# Patient Record
Sex: Male | Born: 1965 | Race: Asian | Hispanic: No | Marital: Married
Health system: Southern US, Community
[De-identification: ages and names within clinical notes are randomized; demographics above are authoritative.]

## PROBLEM LIST (undated history)

## (undated) DIAGNOSIS — I10 Essential (primary) hypertension: Secondary | ICD-10-CM

## (undated) DIAGNOSIS — N189 Chronic kidney disease, unspecified: Secondary | ICD-10-CM

## (undated) DIAGNOSIS — E785 Hyperlipidemia, unspecified: Secondary | ICD-10-CM

---

## 2020-02-24 ENCOUNTER — Emergency Department
Admission: EM | Admit: 2020-02-24 | Discharge: 2020-02-24 | Disposition: A | Discharge status: Other Acute Inpatient Hospital | Payer: BC Managed Care – PPO | Attending: Emergency Medicine | Admitting: Emergency Medicine

## 2020-02-24 ENCOUNTER — Emergency Department: Payer: BC Managed Care – PPO

## 2020-02-24 ENCOUNTER — Encounter: Payer: Self-pay | Admitting: Emergency Medicine

## 2020-02-24 DIAGNOSIS — I129 Hypertensive chronic kidney disease with stage 1 through stage 4 chronic kidney disease, or unspecified chronic kidney disease: Secondary | ICD-10-CM | POA: Insufficient documentation

## 2020-02-24 DIAGNOSIS — N189 Chronic kidney disease, unspecified: Secondary | ICD-10-CM | POA: Insufficient documentation

## 2020-02-24 DIAGNOSIS — R519 Headache, unspecified: Secondary | ICD-10-CM | POA: Diagnosis present

## 2020-02-24 DIAGNOSIS — R4182 Altered mental status, unspecified: Secondary | ICD-10-CM

## 2020-02-24 DIAGNOSIS — Z20822 Contact with and (suspected) exposure to covid-19: Secondary | ICD-10-CM | POA: Insufficient documentation

## 2020-02-24 DIAGNOSIS — R569 Unspecified convulsions: Secondary | ICD-10-CM | POA: Diagnosis not present

## 2020-02-24 DIAGNOSIS — I609 Nontraumatic subarachnoid hemorrhage, unspecified: Secondary | ICD-10-CM | POA: Insufficient documentation

## 2020-02-24 HISTORY — DX: Chronic kidney disease, unspecified: N18.9

## 2020-02-24 HISTORY — DX: Hyperlipidemia, unspecified: E78.5

## 2020-02-24 HISTORY — DX: Essential (primary) hypertension: I10

## 2020-02-24 LAB — URINE DRUG SCREEN, QUALITATIVE (ARMC ONLY)
Amphetamines, Ur Screen: NOT DETECTED
Barbiturates, Ur Screen: NOT DETECTED
Benzodiazepine, Ur Scrn: NOT DETECTED
Cannabinoid 50 Ng, Ur ~~LOC~~: NOT DETECTED
Cocaine Metabolite,Ur ~~LOC~~: NOT DETECTED
MDMA (Ecstasy)Ur Screen: NOT DETECTED
Methadone Scn, Ur: NOT DETECTED
Opiate, Ur Screen: NOT DETECTED
Phencyclidine (PCP) Ur S: NOT DETECTED
Tricyclic, Ur Screen: NOT DETECTED

## 2020-02-24 LAB — CBC WITH DIFFERENTIAL/PLATELET
Abs Immature Granulocytes: 0.08 10*3/uL — ABNORMAL HIGH (ref 0.00–0.07)
Basophils Absolute: 0.1 10*3/uL (ref 0.0–0.1)
Basophils Relative: 0 %
Eosinophils Absolute: 0.1 10*3/uL (ref 0.0–0.5)
Eosinophils Relative: 1 %
HCT: 42.5 % (ref 39.0–52.0)
Hemoglobin: 13.9 g/dL (ref 13.0–17.0)
Immature Granulocytes: 1 %
Lymphocytes Relative: 18 %
Lymphs Abs: 3.2 10*3/uL (ref 0.7–4.0)
MCH: 28.1 pg (ref 26.0–34.0)
MCHC: 32.7 g/dL (ref 30.0–36.0)
MCV: 85.9 fL (ref 80.0–100.0)
Monocytes Absolute: 0.6 10*3/uL (ref 0.1–1.0)
Monocytes Relative: 4 %
Neutro Abs: 13.3 10*3/uL — ABNORMAL HIGH (ref 1.7–7.7)
Neutrophils Relative %: 76 %
Platelets: 222 10*3/uL (ref 150–400)
RBC: 4.95 MIL/uL (ref 4.22–5.81)
RDW: 12.9 % (ref 11.5–15.5)
WBC: 17.4 10*3/uL — ABNORMAL HIGH (ref 4.0–10.5)
nRBC: 0 % (ref 0.0–0.2)

## 2020-02-24 LAB — COMPREHENSIVE METABOLIC PANEL
ALT: 11 U/L (ref 0–44)
AST: 20 U/L (ref 15–41)
Albumin: 3.6 g/dL (ref 3.5–5.0)
Alkaline Phosphatase: 80 U/L (ref 38–126)
Anion gap: 14 (ref 5–15)
BUN: 32 mg/dL — ABNORMAL HIGH (ref 6–20)
CO2: 19 mmol/L — ABNORMAL LOW (ref 22–32)
Calcium: 8.1 mg/dL — ABNORMAL LOW (ref 8.9–10.3)
Chloride: 107 mmol/L (ref 98–111)
Creatinine, Ser: 3.65 mg/dL — ABNORMAL HIGH (ref 0.61–1.24)
GFR, Estimated: 19 mL/min — ABNORMAL LOW (ref >60)
Glucose, Bld: 222 mg/dL — ABNORMAL HIGH (ref 70–99)
Potassium: 3.4 mmol/L — ABNORMAL LOW (ref 3.5–5.1)
Sodium: 140 mmol/L (ref 135–145)
Total Bilirubin: 0.5 mg/dL (ref 0.3–1.2)
Total Protein: 7.3 g/dL (ref 6.5–8.1)

## 2020-02-24 LAB — URINALYSIS, COMPLETE (UACMP) WITH MICROSCOPIC
Bilirubin Urine: NEGATIVE
Glucose, UA: 150 mg/dL — AB
Ketones, ur: NEGATIVE mg/dL
Leukocytes,Ua: NEGATIVE
Nitrite: NEGATIVE
Protein, ur: 100 mg/dL — AB
Specific Gravity, Urine: 1.01 (ref 1.005–1.030)
Squamous Epithelial / HPF: NONE SEEN (ref 0–5)
pH: 7 (ref 5.0–8.0)

## 2020-02-24 LAB — PROTIME-INR
INR: 1.1 (ref 0.8–1.2)
Prothrombin Time: 13.8 seconds (ref 11.4–15.2)

## 2020-02-24 LAB — MAGNESIUM: Magnesium: 2 mg/dL (ref 1.7–2.4)

## 2020-02-24 LAB — RESP PANEL BY RT-PCR (FLU A&B, COVID) ARPGX2
Influenza A by PCR: NEGATIVE
Influenza B by PCR: NEGATIVE
SARS Coronavirus 2 by RT PCR: NEGATIVE

## 2020-02-24 LAB — TROPONIN I (HIGH SENSITIVITY): Troponin I (High Sensitivity): 5 ng/L (ref <18)

## 2020-02-24 MED ORDER — LABETALOL HCL 5 MG/ML IV SOLN: 10.0000 mg | Freq: Once | INTRAVENOUS | Status: AC

## 2020-02-24 MED ORDER — LORAZEPAM 2 MG/ML IJ SOLN
1.0000 mg | Freq: Once | INTRAMUSCULAR | Status: AC
  Administered 2020-02-24: 1 mg via INTRAVENOUS
  Filled 2020-02-24: qty 1

## 2020-02-24 MED ORDER — IOHEXOL 350 MG/ML SOLN
75.0000 mL | Freq: Once | INTRAVENOUS | Status: AC | PRN
  Administered 2020-02-24: 75 mL via INTRAVENOUS

## 2020-02-24 MED ORDER — LACTATED RINGERS IV BOLUS
1000.0000 mL | Freq: Once | INTRAVENOUS | Status: AC
  Administered 2020-02-24: 1000 mL via INTRAVENOUS

## 2020-02-24 MED ORDER — NICARDIPINE HCL IN NACL 20-0.86 MG/200ML-% IV SOLN
3.0000 mg/h | INTRAVENOUS | Status: DC
  Filled 2020-02-24: qty 200

## 2020-02-24 MED ORDER — LABETALOL HCL 5 MG/ML IV SOLN
INTRAVENOUS | Status: AC
  Administered 2020-02-24: 10 mg via INTRAVENOUS
  Filled 2020-02-24: qty 4

## 2020-02-24 MED ORDER — ONDANSETRON HCL 4 MG/2ML IJ SOLN
4.0000 mg | Freq: Once | INTRAMUSCULAR | Status: AC
  Administered 2020-02-24: 4 mg via INTRAVENOUS
  Filled 2020-02-24: qty 2

## 2020-02-24 MED ORDER — LEVETIRACETAM IN NACL 1000 MG/100ML IV SOLN
1000.0000 mg | Freq: Once | INTRAVENOUS | Status: AC
  Administered 2020-02-24: 1000 mg via INTRAVENOUS
  Filled 2020-02-24: qty 100

## 2020-02-24 NOTE — ED Provider Notes (Signed)
[facility] Emergency Department Provider Note   ____________________________________________   Event Date/Time   First MD Initiated Contact with Patient 02/24/20 1009     (approximate)  I have reviewed the triage vital signs and the nursing notes.   HISTORY  Chief Complaint Seizures   HPI Jeff Goodman is a 55 y.o. male with past medical history of hypertension, hyperlipidemia, and CKD who presents to the ED for seizures.  History is limited due to patient's postictal state and language barrier.  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  EMS states there was no witnessed seizure activity but patient appeared postictal on their arrival, was minimally responsive despite receiving Narcan by the fire department.  He gradually woke up during transport and started complaining of a headache as well as nausea.  He remains disoriented on arrival, continually stating "I need to vomit" and complaining of a headache but unable to answer any other questions or follow commands.  Wife was reached over the phone and stated he does not have a history of seizures.        Past Medical History:  Diagnosis Date  . CKD (chronic kidney disease)   . Hyperlipidemia   . Hypertension     There are no problems to display for this patient.   History reviewed. No pertinent surgical history.  Prior to Admission medications   Not on File    Allergies Patient has no allergy information on record.  No family history on file.  Social History    Review of Systems Unable to obtain secondary to altered mental status  ____________________________________________   PHYSICAL EXAM:  VITAL SIGNS: ED Triage Vitals  Enc Vitals Group     BP      Pulse      Resp      Temp      Temp src      SpO2      Weight      Height      Head Circumference      Peak Flow      Pain Score      Pain Loc      Pain Edu?      Excl. in GC?      Constitutional: Awake and alert, disoriented with difficulty answering questions. Eyes: Conjunctivae are normal.  Pupils equal round and reactive to light bilaterally. Head: Atraumatic. Nose: No congestion/rhinnorhea. Mouth/Throat: Mucous membranes are moist. Neck: Normal ROM Cardiovascular: Tachycardic, regular rhythm. Grossly normal heart sounds. Respiratory: Normal respiratory effort.  No retractions. Lungs CTAB. Gastrointestinal: Soft and nontender. No distention. Genitourinary: deferred Musculoskeletal: No lower extremity tenderness nor edema. Neurologic:  Normal speech and language.  Unable to follow commands but spontaneous movement noted in bilateral upper extremities and right lower extremity, decreased movement noted in left lower extremity. Skin:  Skin is warm, dry and intact. No rash noted. Psychiatric: Mood and affect are normal. Speech and behavior are normal.  ____________________________________________   LABS (all labs ordered are listed, but only abnormal results are displayed)  Labs Reviewed  CBC WITH DIFFERENTIAL/PLATELET - Abnormal; Notable for the following components:      Result Value   WBC 17.4 (*)    Neutro Abs 13.3 (*)    Abs Immature Granulocytes 0.08 (*)    All other components within normal limits  RESP PANEL BY RT-PCR (FLU A&B, COVID) ARPGX2  PROTIME-INR  MAGNESIUM  COMPREHENSIVE METABOLIC PANEL  URINE DRUG SCREEN, QUALITATIVE (  [facility] ONLY)  URINALYSIS, COMPLETE (UACMP) WITH MICROSCOPIC  TROPONIN I (HIGH SENSITIVITY)   ____________________________________________  EKG  ED ECG REPORT I, Chesley Noon, the attending physician, personally viewed and interpreted this ECG.   Date: 02/24/2020  EKG Time: 10:10  Rate: 102  Rhythm: sinus tachycardia  Axis: Normal  Intervals:Prolonged QT  ST&T Change: None   PROCEDURES  Procedure(s) performed (including Critical Care):  .Critical Care Performed by: Chesley Noon, MD Authorized by:  Chesley Noon, MD   Critical care provider statement:    Critical care time (minutes):  75   Critical care time was exclusive of:  Separately billable procedures and treating other patients and teaching time   Critical care was necessary to treat or prevent imminent or life-threatening deterioration of the following conditions:  CNS failure or compromise   Critical care was time spent personally by me on the following activities:  Discussions with consultants, evaluation of patient's response to treatment, examination of patient, ordering and performing treatments and interventions, ordering and review of laboratory studies, ordering and review of radiographic studies, pulse oximetry, re-evaluation of patient's condition, obtaining history from patient or surrogate and review of old charts   I assumed direction of critical care for this patient from another provider in my specialty: no       ____________________________________________   INITIAL IMPRESSION / ASSESSMENT AND PLAN / ED COURSE       55 year old male with past medical history of hypertension, hyperlipidemia, and CKD who presents to the ED for altered mental status after being found unresponsive at [employer].  He gradually became more awake and alert with EMS and while there was no witnessed seizure activity, he was presumed to be postictal.  Patient awake and alert on arrival, but perseverating on headache and nausea.  He spontaneously moving both upper extremities and his right lower extremity, but seems to have decreased movement in his left lower extremity.  CT scan is concerning for right-sided subarachnoid hemorrhage, patient given dose of Ativan and loaded with Keppra.  Case discussed with Dr. Adriana Simas of neurosurgery, who recommends transfer to tertiary care facility.  We will attempt to control BP with systolic less than 140, patient to be given IV push of labetalol and we will start Cardene drip if  needed.  ----------------------------------------- 11:38 AM on 02/24/2020 -----------------------------------------  BP is improved following dose of labetalol and we will hold off on initiating Cardene drip.  Case discussed with Dr. Scherry Ran in the neuro ICU at [facility], patient accepted for transfer to [facility] ED.  Patient is awake and alert at this time, answering questions appropriately and protecting his airway.  We will hold off on intubation, currently awaiting arrival of [facility].  ----------------------------------------- 12:39 PM on 02/24/2020 -----------------------------------------  Patient transferred via [facility] at this time.  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  Blood pressure remains improved following labetalol and we will hold off on Cardene drip.  He continues to answer questions appropriately and protect his airway at the time of transfer.      ____________________________________________   FINAL CLINICAL IMPRESSION(S) / ED DIAGNOSES  Final diagnoses:  Subarachnoid hemorrhage (HCC)  Altered mental status, unspecified altered mental status type     ED Discharge Orders    None       Note:  This document was prepared using Dragon voice recognition software and may include unintentional dictation errors.   Chesley Noon, MD 02/24/20 1239

## 2020-02-24 NOTE — ED Notes (Signed)
Pt unable to sign consent for transfer due to altered mental status.  Flight crew aware.

## 2020-02-24 NOTE — ED Notes (Signed)
emtala reviewed by this rn 

## 2020-02-24 NOTE — ED Notes (Signed)
Report called to Leighton Roach, RN.  Pt to be transferred to [facility] - ER to ER

## 2020-02-24 NOTE — ED Triage Notes (Signed)
Pt brought in via EMS.  Possible seizure.  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  Pt had urinated on self, vomiting on arrival to ER, c/o severe headache.

## 2020-02-24 NOTE — ED Notes (Signed)
Report given to [facility] flight team.

## 2020-02-24 NOTE — ED Notes (Signed)
[facility] departed with patient.

## 2022-03-18 IMAGING — CT CT HEAD W/O CM
5 of 12 series · 17 of 47 positions shown, 18 images · non-contrast
Comparison: No pertinent prior exams available for comparison.

CLINICAL DATA: Headache, intracranial hemorrhage suspected. Patient
found down.

EXAM:
CT HEAD WITHOUT CONTRAST
TECHNIQUE: Contiguous axial images were obtained from the base of the skull
through the vertex without intravenous contrast.

[Series 4: head wo · axial · 0.41mm/px · z∈[+243,+398]mm · 3 of 32 slices shown, 4 images]
[im 1/32  brain]
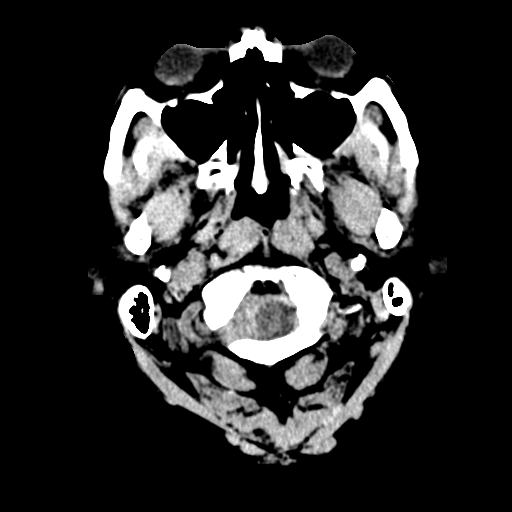
[im 1/32  bone]
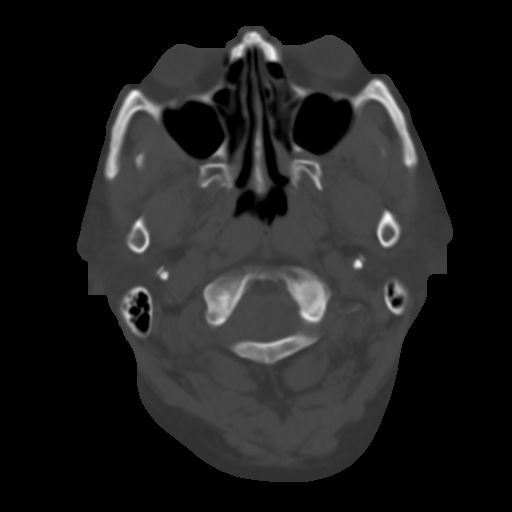
[im 16/32  brain]
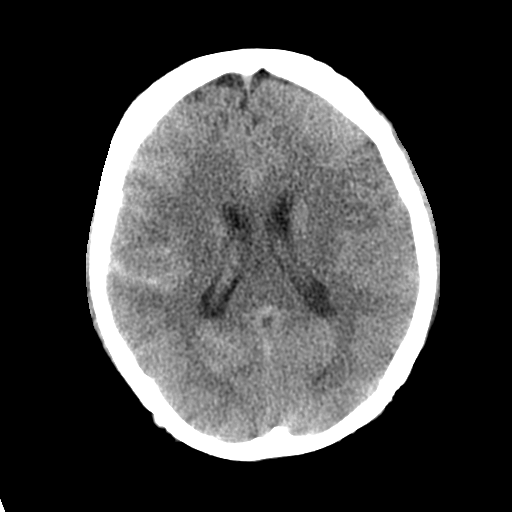
[im 32/32  brain]
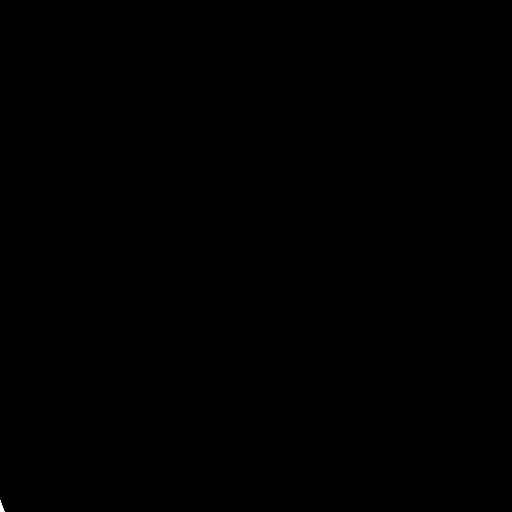

[Series 5: coronal soft tissue · coronal · 0.32mm/px · 3 of 66 slices shown]
[im 22/66  brain]
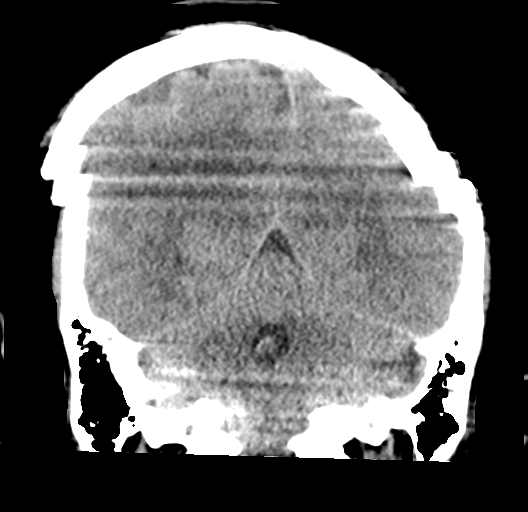
[im 33/66  brain]
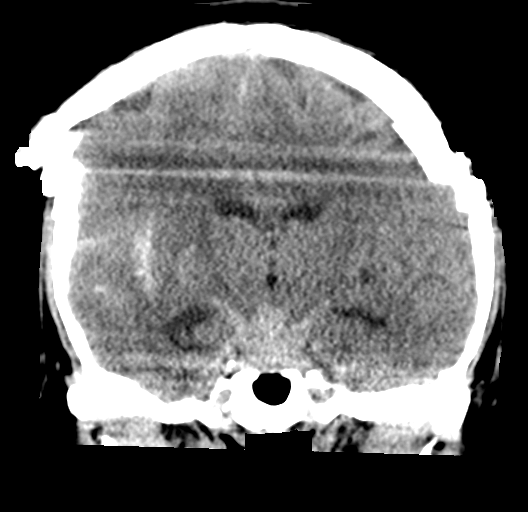
[im 44/66  brain]
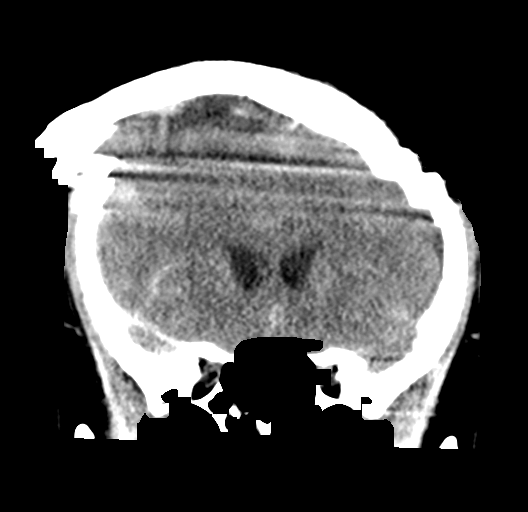

[Series 6: sagittal soft tissue · sagittal · 0.32mm/px · 1 of 57 slices shown]
[im 29/57  brain]
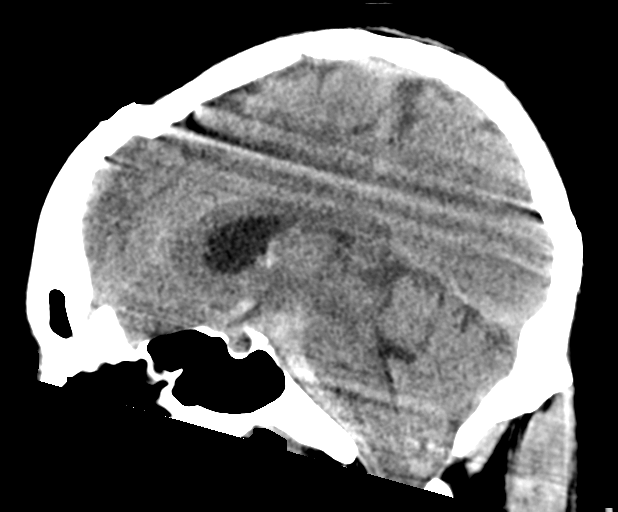

[Series 8: head bone · axial · 0.41mm/px · z∈[+343,+371]mm · 2 of 43 slices shown]
[im 15/43  bone]
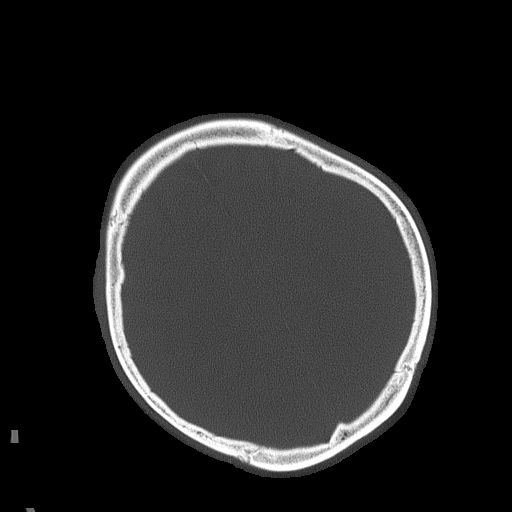
[im 29/43  bone]
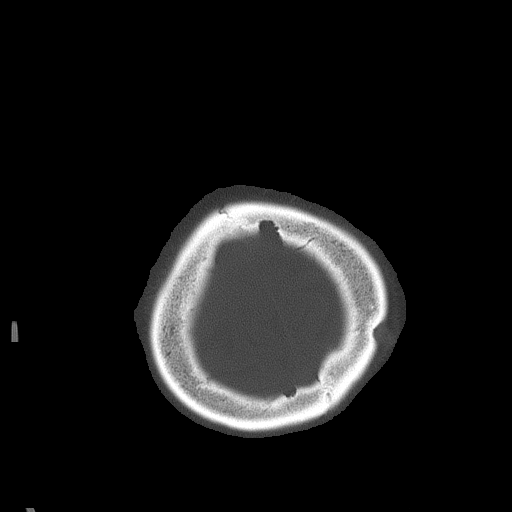

[Series 18: orthogonal bone · axial · 0.29mm/px · z∈[+21,+217]mm · 8 of 129 slices shown]
[im 12/129  bone]
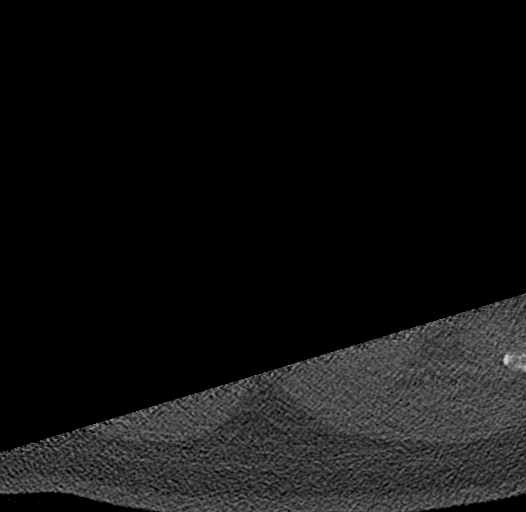
[im 24/129  bone]
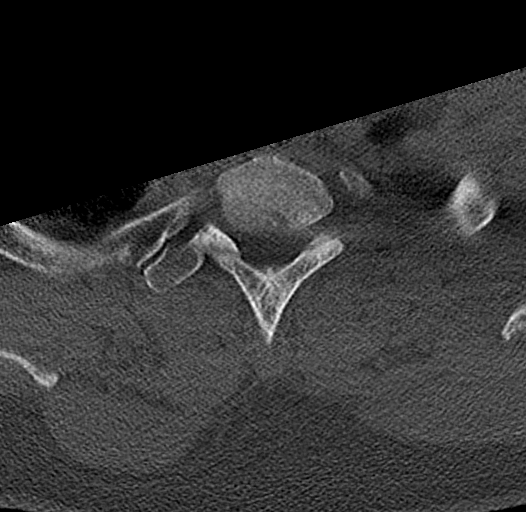
[im 47/129  bone]
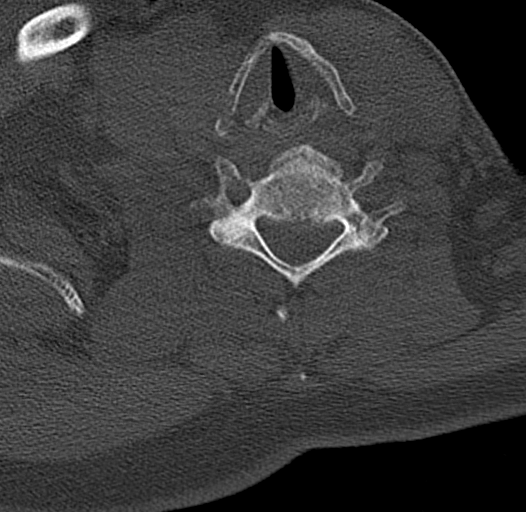
[im 59/129  bone]
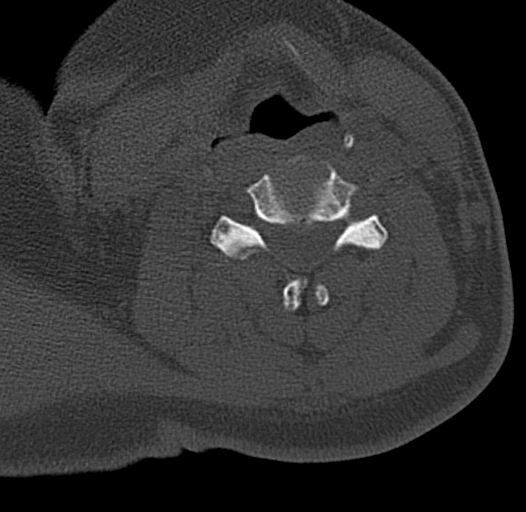
[im 70/129  bone]
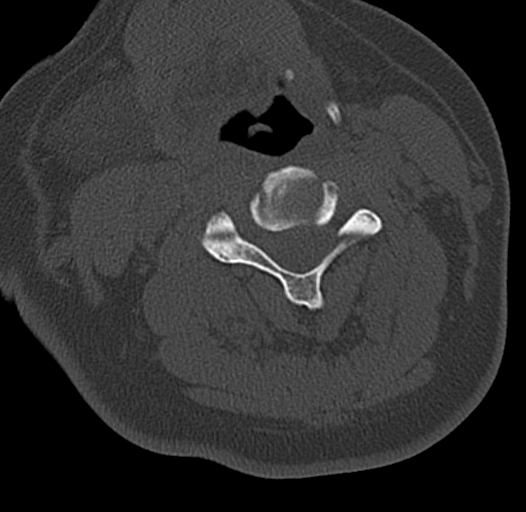
[im 82/129  bone]
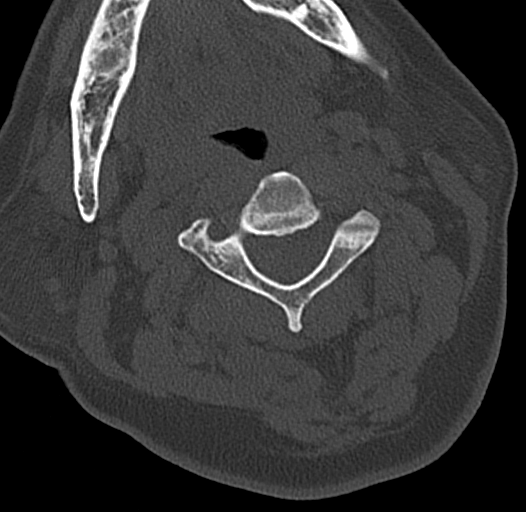
[im 105/129  bone]
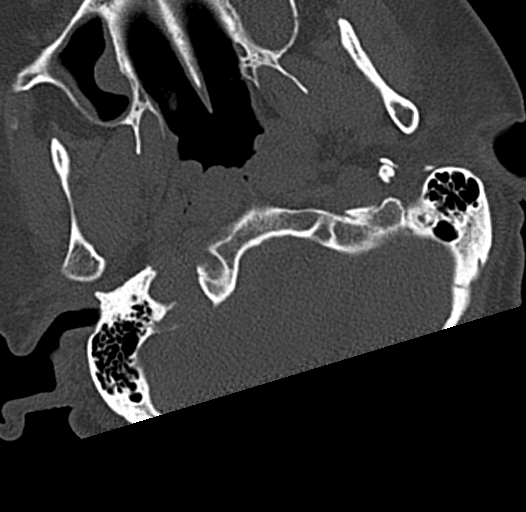
[im 117/129  bone]
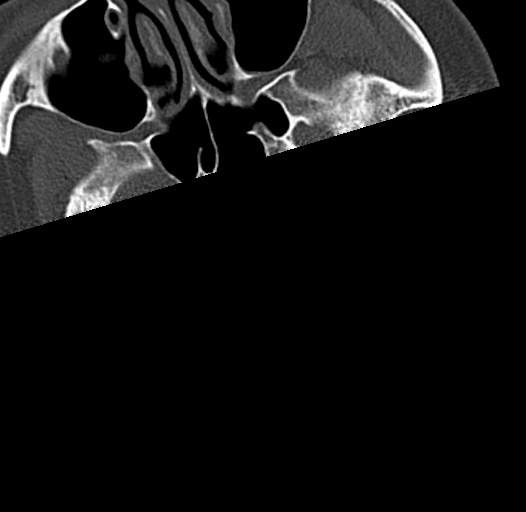

[17 of 47 positions shown; findings below may reference images not displayed]

FINDINGS: Brain:

The examination is moderately motion degraded, limiting evaluation.

There is moderate subarachnoid hemorrhage which is most notable
within the basilar cisterns, posterior fossa, bilateral MCA cisterns
and right greater than left sylvian fissures. Small volume
intraventricular hemorrhage is also present within the anterior
third ventricle and fourth ventricle. No definite evidence of
hydrocephalus on the current examination.

No demarcated cortical infarct.

No extra-axial fluid collection.

No evidence of intracranial mass.

No midline shift.

Vascular: No hyperdense vessel.

Skull: No calvarial fracture. Small hyperdense left parietal scalp
lesion with adjacent smooth left parietal calvarial remodeling
(series 8, image 29).

Sinuses/Orbits: Visualized orbits show no acute finding. No
significant paranasal sinus disease.

These results were called by telephone at the time of interpretation
on 02/24/2020 at [DATE] to provider FEIHU GIANG , who verbally
acknowledged these results.
IMPRESSION: Moderately motion degraded examination, limiting evaluation.

Moderate subarachnoid hemorrhage most notably within the basal
cisterns, posterior fossa, bilateral MCA cisterns and right greater
than left sylvian fissures. Small volume intraventricular hemorrhage
is also present within the anterior third ventricle and fourth
ventricle. The pattern of subarachnoid hemorrhage is highly
suspicious for a ruptured aneurysm. Correlate with pending CT
angiogram head/neck.

No definite evidence of hydrocephalus on the current exam.

Small nonspecific hyperdense left parietal scalp lesion with
adjacent smooth left parietal calvarial bony remodeling.

## 2022-03-18 IMAGING — CT CT ANGIO HEAD
2 of 8 series · 8 of 33 positions shown · IV contrast (APPLIED)
Comparison: Contrast head CT performed earlier today 02/24/2020

CLINICAL DATA: Stroke/TIA, assess intracranial arteries.

EXAM:
CT ANGIOGRAPHY HEAD AND NECK
TECHNIQUE: Multidetector CT imaging of the head and neck was performed using
the standard protocol during bolus administration of intravenous
contrast. Multiplanar CT image reconstructions and MIPs were
obtained to evaluate the vascular anatomy. Carotid stenosis
measurements (when applicable) are obtained utilizing NASCET
criteria, using the distal internal carotid diameter as the
denominator.
CONTRAST:  75mL OMNIPAQUE IOHEXOL 350 MG/ML SOLN

[Series 5: cta head neck thins · axial · 0.51mm/px · z∈[-269,-25]mm · 6 of 683 slices shown]
[im 98/683  soft-tissue]
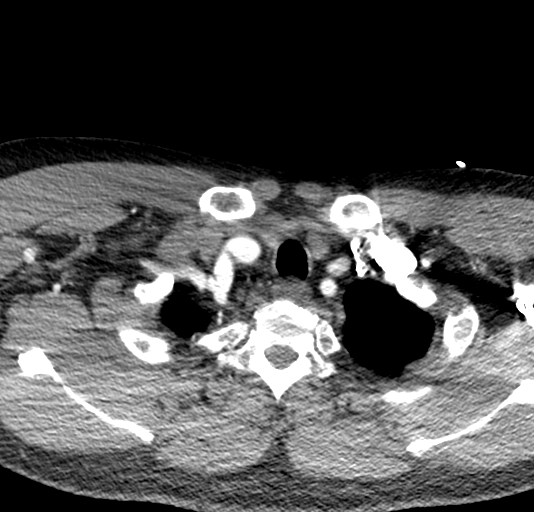
[im 195/683  bone]
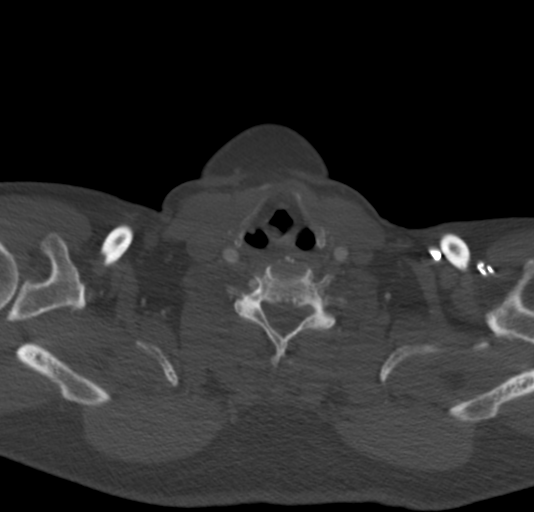
[im 293/683  soft-tissue]
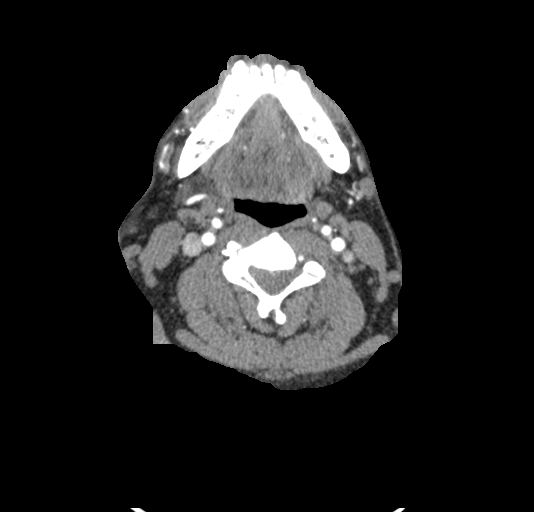
[im 390/683  bone]
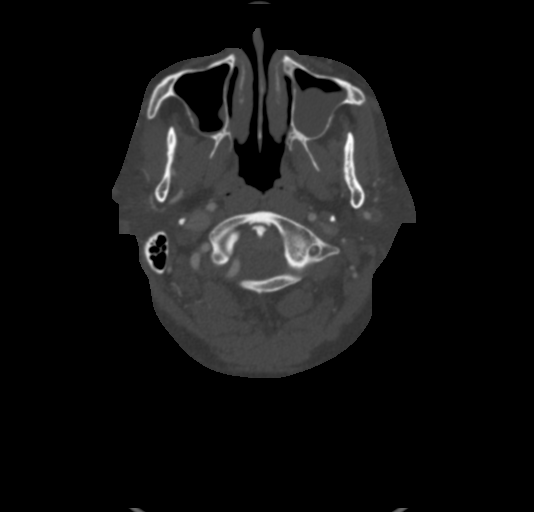
[im 488/683  soft-tissue]
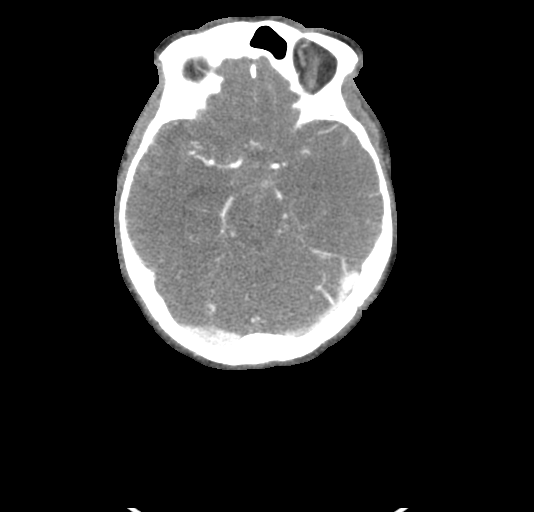
[im 585/683  bone]
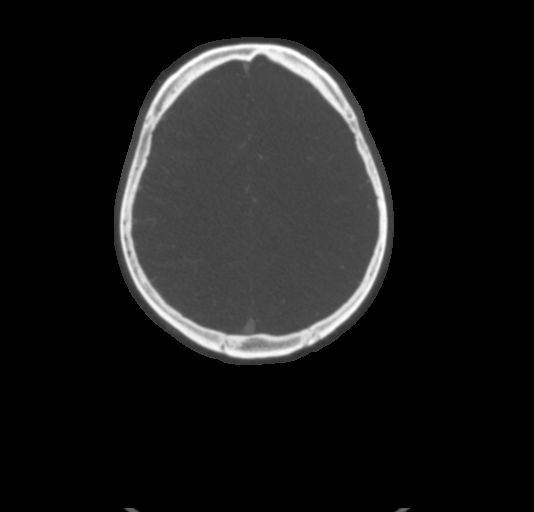

[Series 6: ax thin · axial · 0.51mm/px · z∈[-204,-90]mm · 2 of 342 slices shown]
[im 114/342  soft-tissue]
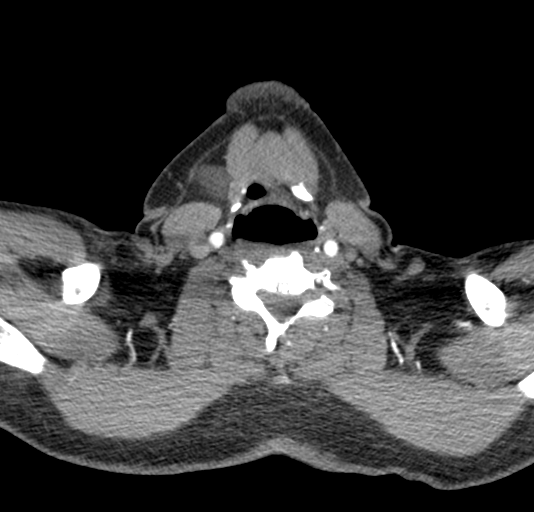
[im 228/342  soft-tissue]
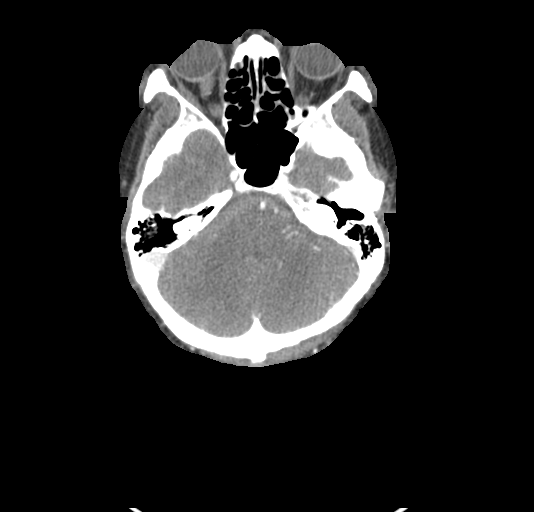

[8 of 33 positions shown; findings below may reference images not displayed]

FINDINGS: CTA NECK FINDINGS

Aortic arch: Standard aortic branching. Visualized aortic arch
unremarkable.

Right carotid system: CCA and ICA patent within the neck without
stenosis.

Left carotid system: CCA and ICA patent within the neck without
stenosis.

Vertebral arteries: Vertebral arteries patent within the neck
without stenosis. Right vertebral artery dominant.

Skeleton: No acute bony abnormality or aggressive osseous lesion.

Other neck: No neck mass or cervical lymphadenopathy.

Upper chest: No consolidation within the imaged lung apices.

Review of the MIP images confirms the above findings

CTA HEAD FINDINGS

Anterior circulation:

The intracranial internal carotid arteries are patent. The M1 middle
cerebral arteries are patent. No M2 proximal branch occlusion or
high-grade proximal stenosis is identified. The anterior cerebral
arteries are patent. 4 mm superiorly directed aneurysm arising from
the right MCA bifurcation (series 10, image 19) (series 9, image
52).

Posterior circulation:

The intracranial left vertebral artery is developmentally
diminutive, but patent. The dominant intracranial right vertebral
artery is patent without stenosis. The basilar artery is patent. The
posterior cerebral arteries are patent. A right posterior
communicating artery is present. The left posterior communicating
artery is hypoplastic or absent.

Venous sinuses: Within the limitations of contrast timing, no
convincing thrombus.

Anatomic variants: As described

Review of the MIP images confirms the above findings

These results were called by telephone at the time of interpretation
on 02/24/2020 at [DATE] to provider KUKU FREUND , who verbally
acknowledged these results.
IMPRESSION: CTA neck:

The common carotid, internal carotid and vertebral arteries are
patent within the neck without stenosis.

CTA head:

1. 4 mm superiorly directed aneurysm arising from the right MCA
bifurcation, likely ruptured and likely the source of acute
subarachnoid hemorrhage.
2. No intracranial large vessel occlusion or proximal high-grade
arterial stenosis.
